# Patient Record
Sex: Female | Born: 1960 | Hispanic: Yes | Marital: Single | Smoking: Never smoker
Health system: Southern US, Community
[De-identification: ages and names within clinical notes are randomized; demographics above are authoritative.]

## PROBLEM LIST (undated history)

## (undated) DIAGNOSIS — J45909 Unspecified asthma, uncomplicated: Secondary | ICD-10-CM

## (undated) DIAGNOSIS — I959 Hypotension, unspecified: Secondary | ICD-10-CM

## (undated) DIAGNOSIS — E119 Type 2 diabetes mellitus without complications: Secondary | ICD-10-CM

## (undated) HISTORY — PX: GASTRIC BYPASS: SHX52

## (undated) HISTORY — PX: NASAL SEPTUM SURGERY: SHX37

---

## 2014-02-13 ENCOUNTER — Emergency Department (HOSPITAL_COMMUNITY): Payer: Medicare Other

## 2014-02-13 ENCOUNTER — Encounter (HOSPITAL_COMMUNITY): Payer: Self-pay | Admitting: Emergency Medicine

## 2014-02-13 ENCOUNTER — Emergency Department (HOSPITAL_COMMUNITY)
Admission: EM | Admit: 2014-02-13 | Discharge: 2014-02-13 | Disposition: A | Discharge status: Home / Self Care | Payer: Medicare Other | Attending: Emergency Medicine | Admitting: Emergency Medicine

## 2014-02-13 DIAGNOSIS — I959 Hypotension, unspecified: Secondary | ICD-10-CM | POA: Insufficient documentation

## 2014-02-13 DIAGNOSIS — S7010XA Contusion of unspecified thigh, initial encounter: Secondary | ICD-10-CM | POA: Insufficient documentation

## 2014-02-13 DIAGNOSIS — S7002XA Contusion of left hip, initial encounter: Secondary | ICD-10-CM

## 2014-02-13 DIAGNOSIS — X58XXXA Exposure to other specified factors, initial encounter: Secondary | ICD-10-CM | POA: Insufficient documentation

## 2014-02-13 DIAGNOSIS — Z79899 Other long term (current) drug therapy: Secondary | ICD-10-CM | POA: Insufficient documentation

## 2014-02-13 DIAGNOSIS — J45909 Unspecified asthma, uncomplicated: Secondary | ICD-10-CM | POA: Insufficient documentation

## 2014-02-13 DIAGNOSIS — Z3202 Encounter for pregnancy test, result negative: Secondary | ICD-10-CM | POA: Insufficient documentation

## 2014-02-13 DIAGNOSIS — S7012XA Contusion of left thigh, initial encounter: Secondary | ICD-10-CM

## 2014-02-13 DIAGNOSIS — S7000XA Contusion of unspecified hip, initial encounter: Secondary | ICD-10-CM | POA: Insufficient documentation

## 2014-02-13 DIAGNOSIS — S139XXA Sprain of joints and ligaments of unspecified parts of neck, initial encounter: Secondary | ICD-10-CM | POA: Insufficient documentation

## 2014-02-13 DIAGNOSIS — Y929 Unspecified place or not applicable: Secondary | ICD-10-CM | POA: Insufficient documentation

## 2014-02-13 DIAGNOSIS — Y9361 Activity, american tackle football: Secondary | ICD-10-CM | POA: Insufficient documentation

## 2014-02-13 DIAGNOSIS — E119 Type 2 diabetes mellitus without complications: Secondary | ICD-10-CM | POA: Insufficient documentation

## 2014-02-13 DIAGNOSIS — S161XXA Strain of muscle, fascia and tendon at neck level, initial encounter: Secondary | ICD-10-CM

## 2014-02-13 HISTORY — DX: Hypotension, unspecified: I95.9

## 2014-02-13 HISTORY — DX: Type 2 diabetes mellitus without complications: E11.9

## 2014-02-13 HISTORY — DX: Unspecified asthma, uncomplicated: J45.909

## 2014-02-13 LAB — POC URINE PREG, ED: Preg Test, Ur: NEGATIVE

## 2014-02-13 MED ORDER — HYDROCODONE-ACETAMINOPHEN 5-325 MG PO TABS: 1.0000 | ORAL_TABLET | ORAL | Status: DC | PRN

## 2014-02-13 NOTE — Discharge Instructions (Signed)
Contusion A contusion is a deep bruise. Contusions happen when an injury causes bleeding under the skin. Signs of bruising include pain, puffiness (swelling), and discolored skin. The contusion may turn blue, purple, or yellow. HOME CARE   Put ice on the injured area.  Put ice in a plastic bag.  Place a towel between your skin and the bag.  Leave the ice on for 15-20 minutes, 03-04 times a day.  Only take medicine as told by your doctor.  Rest the injured area.  If possible, raise (elevate) the injured area to lessen puffiness. GET HELP RIGHT AWAY IF:   You have more bruising or puffiness.  You have pain that is getting worse.  Your puffiness or pain is not helped by medicine. MAKE SURE YOU:   Understand these instructions.  Will watch your condition.  Will get help right away if you are not doing well or get worse. Document Released: 01/28/2008 Document Revised: 11/03/2011 Document Reviewed: 06/16/2011 [facility] Patient Information 2015 [place]. This information is not intended to replace advice given to you by your health care provider. Make sure you discuss any questions you have with your health care provider.  Cervical Sprain A cervical sprain is an injury in the neck in which the strong, fibrous tissues (ligaments) that connect your neck bones stretch or tear. Cervical sprains can range from mild to severe. Severe cervical sprains can cause the neck vertebrae to be unstable. This can lead to damage of the spinal cord and can result in serious nervous system problems. The amount of time it takes for a cervical sprain to get better depends on the cause and extent of the injury. Most cervical sprains heal in 1 to 3 weeks. CAUSES  Severe cervical sprains may be caused by:   Contact sport injuries (such as from football, rugby, wrestling, hockey, auto racing, gymnastics, diving, martial arts, or boxing).   Motor vehicle collisions.   Whiplash injuries. This is an  injury from a sudden forward and backward whipping movement of the head and neck.  Falls.  Mild cervical sprains may be caused by:   Being in an awkward position, such as while cradling a telephone between your ear and shoulder.   Sitting in a chair that does not offer proper support.   Working at a poorly Marketing executivedesigned computer station.   Looking up or down for long periods of time.  SYMPTOMS   Pain, soreness, stiffness, or a burning sensation in the front, back, or sides of the neck. This discomfort may develop immediately after the injury or slowly, 24 hours or more after the injury.   Pain or tenderness directly in the middle of the back of the neck.   Shoulder or upper back pain.   Limited ability to move the neck.   Headache.   Dizziness.   Weakness, numbness, or tingling in the hands or arms.   Muscle spasms.   Difficulty swallowing or chewing.   Tenderness and swelling of the neck.  DIAGNOSIS  Most of the time your health care provider can diagnose a cervical sprain by taking your history and doing a physical exam. Your health care provider will ask about previous neck injuries and any known neck problems, such as arthritis in the neck. X-rays may be taken to find out if there are any other problems, such as with the bones of the neck. Other tests, such as a CT scan or MRI, may also be needed.  TREATMENT  Treatment depends on  the severity of the cervical sprain. Mild sprains can be treated with rest, keeping the neck in place (immobilization), and pain medicines. Severe cervical sprains are immediately immobilized. Further treatment is done to help with pain, muscle spasms, and other symptoms and may include:  Medicines, such as pain relievers, numbing medicines, or muscle relaxants.   Physical therapy. This may involve stretching exercises, strengthening exercises, and posture training. Exercises and improved posture can help stabilize the neck, strengthen  muscles, and help stop symptoms from returning.  HOME CARE INSTRUCTIONS   Put ice on the injured area.   Put ice in a plastic bag.   Place a towel between your skin and the bag.   Leave the ice on for 15-20 minutes, 3-4 times a day.   If your injury was severe, you may have been given a cervical collar to wear. A cervical collar is a two-piece collar designed to keep your neck from moving while it heals.  Do not remove the collar unless instructed by your health care provider.  If you have long hair, keep it outside of the collar.  Ask your health care provider before making any adjustments to your collar. Minor adjustments may be required over time to improve comfort and reduce pressure on your chin or on the back of your head.  Ifyou are allowed to remove the collar for cleaning or bathing, follow your health care provider's instructions on how to do so safely.  Keep your collar clean by wiping it with mild soap and water and drying it completely. If the collar you have been given includes removable pads, remove them every 1-2 days and hand wash them with soap and water. Allow them to air dry. They should be completely dry before you wear them in the collar.  If you are allowed to remove the collar for cleaning and bathing, wash and dry the skin of your neck. Check your skin for irritation or sores. If you see any, tell your health care provider.  Do not drive while wearing the collar.   Only take over-the-counter or prescription medicines for pain, discomfort, or fever as directed by your health care provider.   Keep all follow-up appointments as directed by your health care provider.   Keep all physical therapy appointments as directed by your health care provider.   Make any needed adjustments to your workstation to promote good posture.   Avoid positions and activities that make your symptoms worse.   Warm up and stretch before being active to help prevent  problems.  SEEK MEDICAL CARE IF:   Your pain is not controlled with medicine.   You are unable to decrease your pain medicine over time as planned.   Your activity level is not improving as expected.  SEEK IMMEDIATE MEDICAL CARE IF:   You develop any bleeding.  You develop stomach upset.  You have signs of an allergic reaction to your medicine.   Your symptoms get worse.   You develop new, unexplained symptoms.   You have numbness, tingling, weakness, or paralysis in any part of your body.  MAKE SURE YOU:   Understand these instructions.  Will watch your condition.  Will get help right away if you are not doing well or get worse. Document Released: 06/08/2007 Document Revised: 08/16/2013 Document Reviewed: 02/16/2013 [facility] Patient Information 2015 [place], [place]. This information is not intended to replace advice given to you by your health care provider. Make sure you discuss any questions you have with  your health care provider.   Your xrays are negative for acute bony injury (no broken bones).  You have deep bruising which should get better with rest and time.  Apply ice packs to your thigh, groin and neck as much as it is comfortable to do though tomorrow.  You may add a heating pad for 20 minutes 3 times daily starting on Wednesday.  You may take the hydrocodone prescribed for pain relief.  This will make you drowsy - do not drive within 4 hours of taking this medication.  Continue taking your ibuprofen.
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
## 2014-02-16 NOTE — ED Provider Notes (Signed)
Medical screening examination/treatment/procedure(s) were performed by non-physician practitioner and as supervising physician I was immediately available for consultation/collaboration.   EKG Interpretation None        Vanetta MuldersScott Kiyoshi Schaab, MD 02/16/14 1550

## 2014-08-04 ENCOUNTER — Encounter (HOSPITAL_COMMUNITY): Payer: Self-pay | Admitting: Emergency Medicine

## 2014-08-04 ENCOUNTER — Emergency Department (HOSPITAL_COMMUNITY): Payer: Medicare Other

## 2014-08-04 ENCOUNTER — Emergency Department (HOSPITAL_COMMUNITY)
Admission: EM | Admit: 2014-08-04 | Discharge: 2014-08-04 | Disposition: A | Discharge status: Home / Self Care | Payer: Medicare Other | Attending: Emergency Medicine | Admitting: Emergency Medicine

## 2014-08-04 DIAGNOSIS — Y998 Other external cause status: Secondary | ICD-10-CM | POA: Insufficient documentation

## 2014-08-04 DIAGNOSIS — E119 Type 2 diabetes mellitus without complications: Secondary | ICD-10-CM | POA: Diagnosis not present

## 2014-08-04 DIAGNOSIS — N938 Other specified abnormal uterine and vaginal bleeding: Secondary | ICD-10-CM | POA: Diagnosis not present

## 2014-08-04 DIAGNOSIS — J45909 Unspecified asthma, uncomplicated: Secondary | ICD-10-CM | POA: Insufficient documentation

## 2014-08-04 DIAGNOSIS — Z3202 Encounter for pregnancy test, result negative: Secondary | ICD-10-CM | POA: Insufficient documentation

## 2014-08-04 DIAGNOSIS — S060X1A Concussion with loss of consciousness of 30 minutes or less, initial encounter: Secondary | ICD-10-CM | POA: Insufficient documentation

## 2014-08-04 DIAGNOSIS — I10 Essential (primary) hypertension: Secondary | ICD-10-CM | POA: Insufficient documentation

## 2014-08-04 DIAGNOSIS — Z79899 Other long term (current) drug therapy: Secondary | ICD-10-CM | POA: Insufficient documentation

## 2014-08-04 DIAGNOSIS — S300XXA Contusion of lower back and pelvis, initial encounter: Secondary | ICD-10-CM | POA: Insufficient documentation

## 2014-08-04 DIAGNOSIS — S40012A Contusion of left shoulder, initial encounter: Secondary | ICD-10-CM | POA: Diagnosis not present

## 2014-08-04 DIAGNOSIS — Y9389 Activity, other specified: Secondary | ICD-10-CM | POA: Diagnosis not present

## 2014-08-04 DIAGNOSIS — Y9289 Other specified places as the place of occurrence of the external cause: Secondary | ICD-10-CM | POA: Diagnosis not present

## 2014-08-04 DIAGNOSIS — S161XXA Strain of muscle, fascia and tendon at neck level, initial encounter: Secondary | ICD-10-CM

## 2014-08-04 DIAGNOSIS — T7421XA Adult sexual abuse, confirmed, initial encounter: Secondary | ICD-10-CM | POA: Insufficient documentation

## 2014-08-04 LAB — CBG MONITORING, ED: Glucose-Capillary: 101 mg/dL — ABNORMAL HIGH (ref 70–99)

## 2014-08-04 LAB — PREGNANCY, URINE: Preg Test, Ur: NEGATIVE

## 2014-08-04 LAB — URINALYSIS, ROUTINE W REFLEX MICROSCOPIC
BILIRUBIN URINE: NEGATIVE
Glucose, UA: NEGATIVE mg/dL
Hgb urine dipstick: NEGATIVE
KETONES UR: NEGATIVE mg/dL
Leukocytes, UA: NEGATIVE
NITRITE: NEGATIVE
Protein, ur: NEGATIVE mg/dL
SPECIFIC GRAVITY, URINE: 1.022 (ref 1.005–1.030)
UROBILINOGEN UA: 0.2 mg/dL (ref 0.0–1.0)
pH: 6.5 (ref 5.0–8.0)

## 2014-08-04 LAB — WET PREP, GENITAL
Clue Cells Wet Prep HPF POC: NONE SEEN
Trich, Wet Prep: NONE SEEN
YEAST WET PREP: NONE SEEN

## 2014-08-04 LAB — HIV ANTIBODY (ROUTINE TESTING W REFLEX): HIV: NONREACTIVE

## 2014-08-04 LAB — RPR

## 2014-08-04 MED ORDER — DOXYCYCLINE HYCLATE 100 MG PO CAPS: 100.0000 mg | ORAL_CAPSULE | Freq: Two times a day (BID) | ORAL | Status: AC

## 2014-08-04 MED ORDER — LIDOCAINE HCL (PF) 1 % IJ SOLN
0.9000 mL | Freq: Once | INTRAMUSCULAR | Status: AC
  Administered 2014-08-04: 0.9 mL
  Filled 2014-08-04: qty 5

## 2014-08-04 MED ORDER — HYDROCODONE-ACETAMINOPHEN 5-325 MG PO TABS: 1.0000 | ORAL_TABLET | Freq: Four times a day (QID) | ORAL | Status: DC | PRN

## 2014-08-04 MED ORDER — DOXYCYCLINE HYCLATE 100 MG PO CAPS: 100.0000 mg | ORAL_CAPSULE | Freq: Two times a day (BID) | ORAL | Status: DC

## 2014-08-04 MED ORDER — CEFTRIAXONE SODIUM 250 MG IJ SOLR
250.0000 mg | Freq: Once | INTRAMUSCULAR | Status: AC
  Administered 2014-08-04: 250 mg via INTRAMUSCULAR
  Filled 2014-08-04: qty 250

## 2014-08-04 MED ORDER — IBUPROFEN 800 MG PO TABS: 800.0000 mg | ORAL_TABLET | Freq: Three times a day (TID) | ORAL | Status: DC

## 2014-08-04 MED ORDER — IBUPROFEN 800 MG PO TABS: 800.0000 mg | ORAL_TABLET | Freq: Three times a day (TID) | ORAL | Status: AC

## 2014-08-04 MED ORDER — CEFTRIAXONE SODIUM 250 MG IJ SOLR: 250.0000 mg | Freq: Once | INTRAMUSCULAR | Status: DC

## 2014-08-04 MED ORDER — IBUPROFEN 800 MG PO TABS
800.0000 mg | ORAL_TABLET | Freq: Once | ORAL | Status: AC
  Administered 2014-08-04: 800 mg via ORAL
  Filled 2014-08-04: qty 1

## 2014-08-04 NOTE — Discharge Instructions (Signed)
Call for a follow up appointment with a Family or Primary Care Provider.  Call women's clinic for further evaluation of your possible STD exposure. Return if Symptoms worsen.   Take medication as prescribed.  Take ibuprofen 800 mg 3 times a day, ice your shoulder, shoulder, low back 3 times daily.
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
## 2014-08-05 LAB — GC/CHLAMYDIA PROBE AMP
CT PROBE, AMP APTIMA: NEGATIVE
GC Probe RNA: NEGATIVE

## 2014-08-15 ENCOUNTER — Emergency Department (HOSPITAL_COMMUNITY)
Admission: EM | Admit: 2014-08-15 | Discharge: 2014-08-15 | Disposition: A | Discharge status: Home / Self Care | Payer: Medicare Other | Attending: Emergency Medicine | Admitting: Emergency Medicine

## 2014-08-15 ENCOUNTER — Encounter (HOSPITAL_COMMUNITY): Payer: Self-pay | Admitting: *Deleted

## 2014-08-15 ENCOUNTER — Emergency Department (HOSPITAL_COMMUNITY): Payer: Medicare Other

## 2014-08-15 DIAGNOSIS — Z791 Long term (current) use of non-steroidal anti-inflammatories (NSAID): Secondary | ICD-10-CM | POA: Diagnosis not present

## 2014-08-15 DIAGNOSIS — G8911 Acute pain due to trauma: Secondary | ICD-10-CM | POA: Insufficient documentation

## 2014-08-15 DIAGNOSIS — Z760 Encounter for issue of repeat prescription: Secondary | ICD-10-CM | POA: Diagnosis present

## 2014-08-15 DIAGNOSIS — E119 Type 2 diabetes mellitus without complications: Secondary | ICD-10-CM | POA: Diagnosis not present

## 2014-08-15 DIAGNOSIS — Z79899 Other long term (current) drug therapy: Secondary | ICD-10-CM | POA: Insufficient documentation

## 2014-08-15 DIAGNOSIS — M25571 Pain in right ankle and joints of right foot: Secondary | ICD-10-CM | POA: Diagnosis not present

## 2014-08-15 DIAGNOSIS — Z87828 Personal history of other (healed) physical injury and trauma: Secondary | ICD-10-CM | POA: Insufficient documentation

## 2014-08-15 DIAGNOSIS — R2242 Localized swelling, mass and lump, left lower limb: Secondary | ICD-10-CM | POA: Diagnosis not present

## 2014-08-15 DIAGNOSIS — Z792 Long term (current) use of antibiotics: Secondary | ICD-10-CM | POA: Insufficient documentation

## 2014-08-15 DIAGNOSIS — M25511 Pain in right shoulder: Secondary | ICD-10-CM | POA: Diagnosis not present

## 2014-08-15 DIAGNOSIS — M25572 Pain in left ankle and joints of left foot: Secondary | ICD-10-CM | POA: Diagnosis not present

## 2014-08-15 DIAGNOSIS — J45909 Unspecified asthma, uncomplicated: Secondary | ICD-10-CM | POA: Diagnosis not present

## 2014-08-15 DIAGNOSIS — Z8679 Personal history of other diseases of the circulatory system: Secondary | ICD-10-CM | POA: Insufficient documentation

## 2014-08-15 MED ORDER — OXYCODONE-ACETAMINOPHEN 5-325 MG PO TABS
1.0000 | ORAL_TABLET | Freq: Once | ORAL | Status: AC
  Administered 2014-08-15: 1 via ORAL
  Filled 2014-08-15: qty 1

## 2014-08-15 MED ORDER — HYDROCODONE-ACETAMINOPHEN 5-325 MG PO TABS: 1.0000 | ORAL_TABLET | Freq: Four times a day (QID) | ORAL | Status: AC | PRN
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
## 2014-08-15 NOTE — Discharge Instructions (Signed)
Call an orthopedic specialist for further evaluation of your shoulder injury and ankle injury. Ibuprofen 800 mg for pain 3 times a day. Ice your shoulder and ankle 3-4 times a day. Where your sling until you're evaluated by a orthopedic specialist. Call for a follow up appointment with a Family or Primary Care Provider.  Return if Symptoms worsen.   Take medication as prescribed.
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
## 2014-08-21 IMAGING — CR DG HIP (WITH OR WITHOUT PELVIS) 2-3V*L*
3 series · 3 of 3 positions shown · non-contrast
Comparison: None.

CLINICAL DATA: GROIN PAIN

EXAM:
LEFT HIP - COMPLETE 2+ VIEW

[view not recorded (1 of 3)]
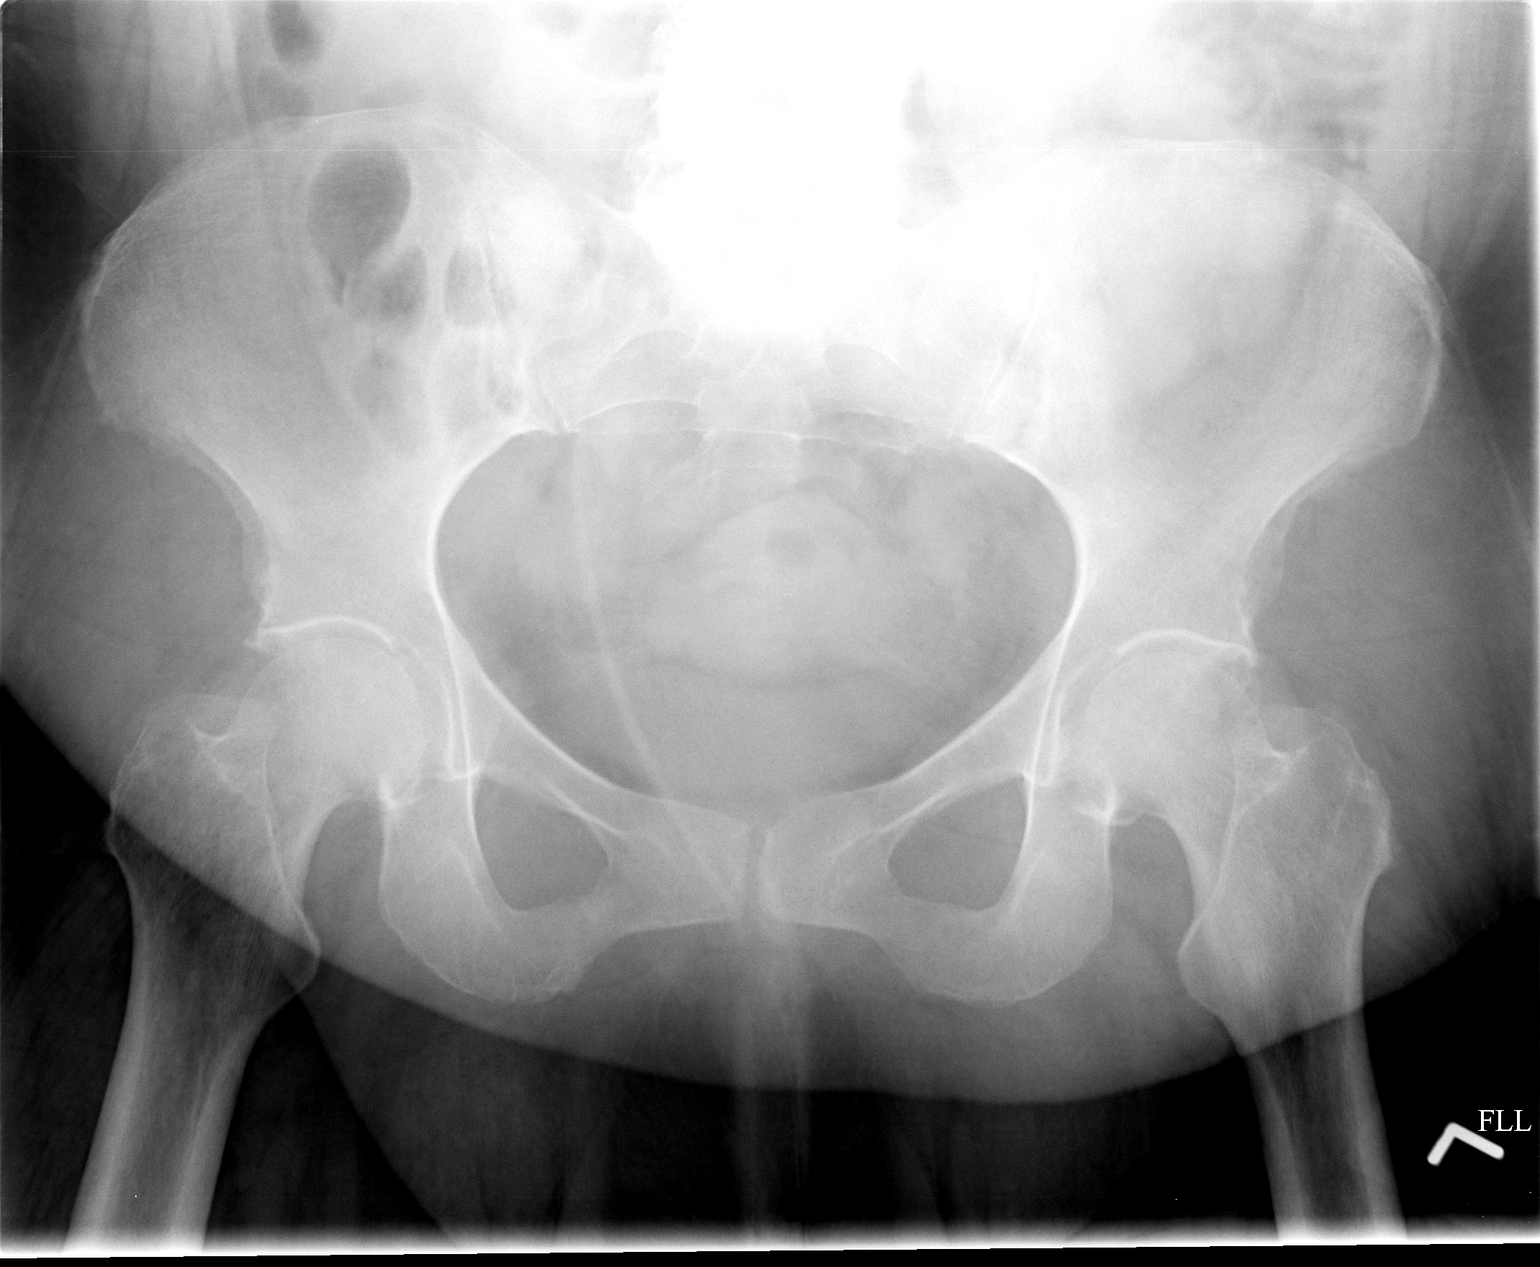

[view not recorded (2 of 3)]
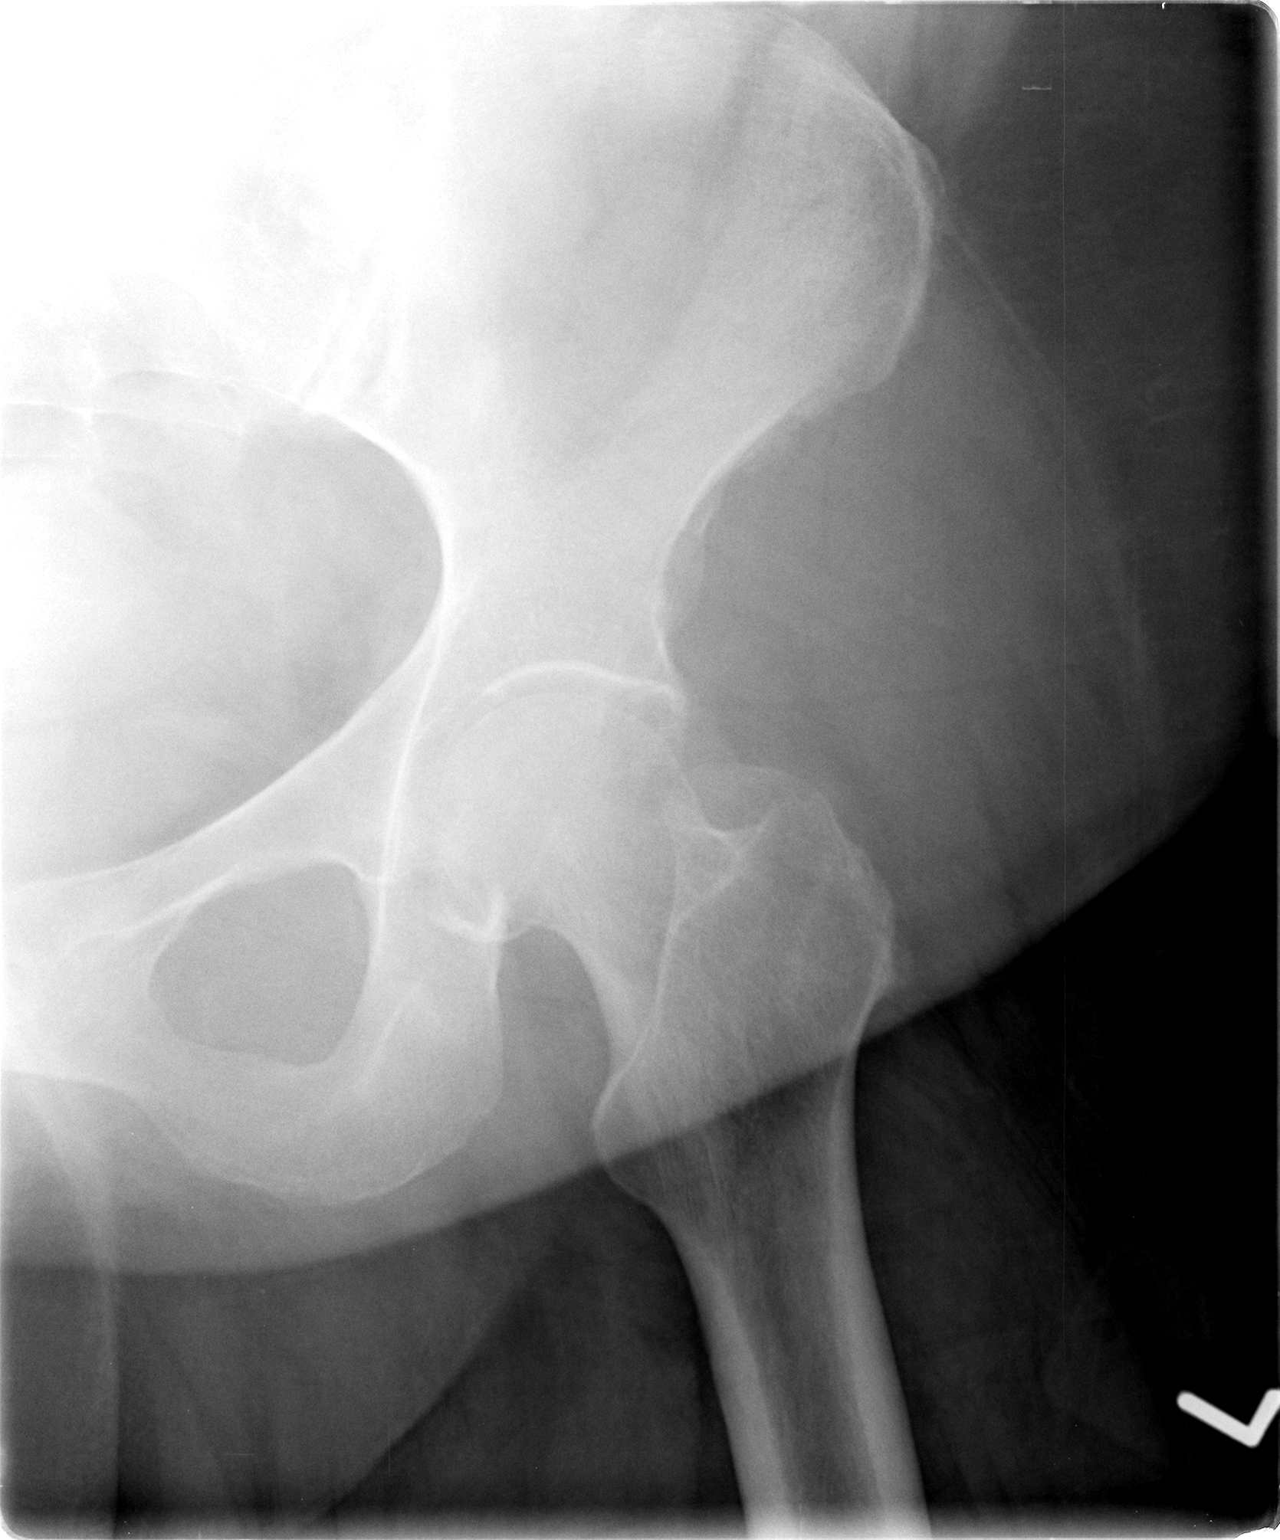

[view not recorded (3 of 3)]
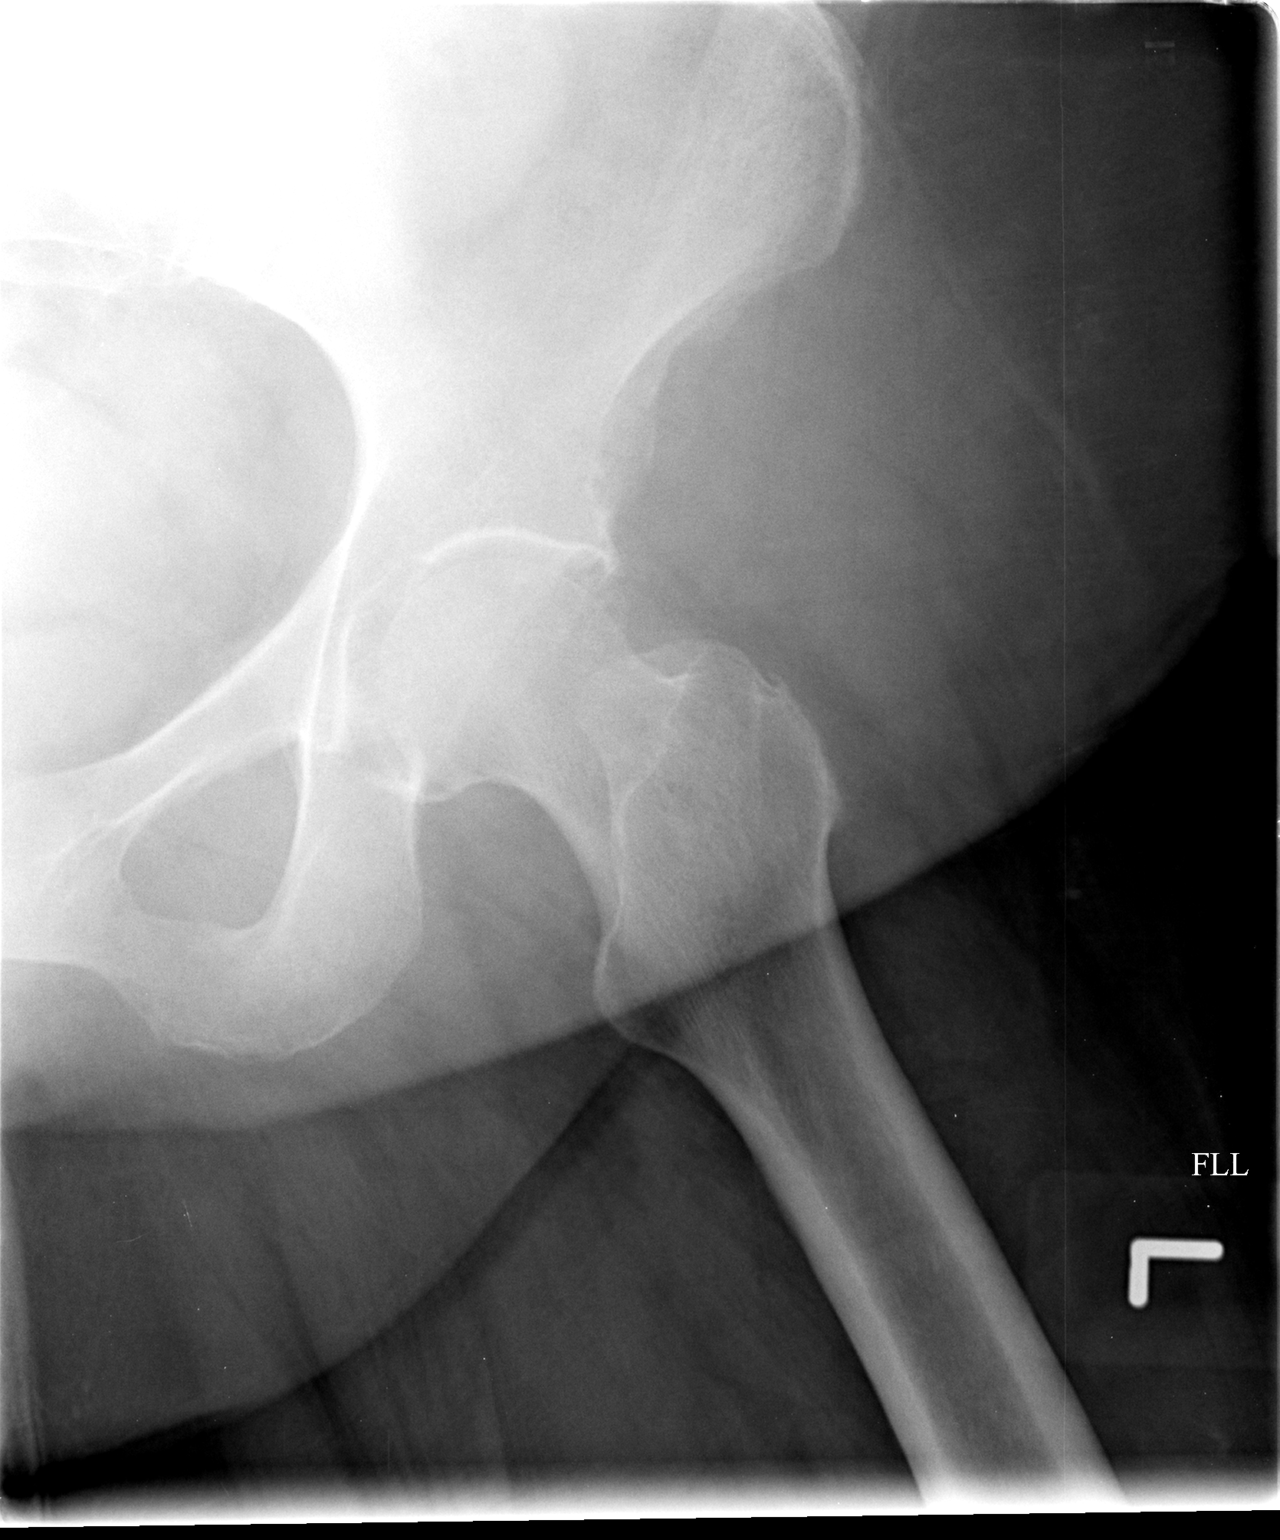

[3 of 3 positions shown; findings below may reference images not displayed]

FINDINGS: There is no evidence of hip fracture or dislocation. Mild peripheral
hypertrophic spurring and mild subchondral sclerosis. Degenerative
disc disease changes within the lower lumbar spine.
IMPRESSION: Osteoarthritic changes.  No acute osseous abnormalities.
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
